# Patient Record
Sex: Male | Born: 1995 | Race: White | Hispanic: No | Marital: Single | Smoking: Never smoker
Health system: Southern US, Community
[De-identification: ages and names within clinical notes are randomized; demographics above are authoritative.]

---
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
## 2014-08-22 ENCOUNTER — Emergency Department (HOSPITAL_COMMUNITY)
Admission: EM | Admit: 2014-08-22 | Discharge: 2014-08-22 | Disposition: A | Discharge status: Home / Self Care | Payer: Medicaid Other | Attending: Emergency Medicine | Admitting: Emergency Medicine

## 2014-08-22 ENCOUNTER — Encounter (HOSPITAL_COMMUNITY): Payer: Self-pay | Admitting: Emergency Medicine

## 2014-08-22 DIAGNOSIS — Z79899 Other long term (current) drug therapy: Secondary | ICD-10-CM | POA: Insufficient documentation

## 2014-08-22 DIAGNOSIS — J069 Acute upper respiratory infection, unspecified: Secondary | ICD-10-CM | POA: Diagnosis not present

## 2014-08-22 DIAGNOSIS — R05 Cough: Secondary | ICD-10-CM | POA: Diagnosis present

## 2014-08-22 MED ORDER — BENZONATATE 100 MG PO CAPS
200.0000 mg | ORAL_CAPSULE | Freq: Once | ORAL | Status: AC
  Administered 2014-08-22: 200 mg via ORAL
  Filled 2014-08-22: qty 2

## 2014-08-22 MED ORDER — BENZONATATE 100 MG PO CAPS: 100.0000 mg | ORAL_CAPSULE | Freq: Three times a day (TID) | ORAL | Status: DC

## 2014-08-22 MED ORDER — PROMETHAZINE-CODEINE 6.25-10 MG/5ML PO SYRP: 5.0000 mL | ORAL_SOLUTION | ORAL | Status: AC | PRN

## 2014-08-22 NOTE — ED Provider Notes (Signed)
CSN: [id number]     Arrival date & time 08/22/14  1929 History   First MD Initiated Contact with Patient 08/22/14 1952     Chief Complaint  Patient presents with  . Cough     (Consider location/radiation/quality/duration/timing/severity/associated sxs/prior Treatment) The history is provided by the patient.   Adam LombardDaniel T Yokum is a 18 y.o. male presenting with 5 day history of uri type symptoms which includes nasal congestion with clear rhinorrhea, dry throat, low grade fever to 100 yesterday and nonproductive cough.  Symptoms due to not include shortness of breath, chest pain,  Nausea, vomiting or diarrhea.  The patient has taken dayquill and mucinex prior to arrival with no significant improvement in symptoms.      History reviewed. No pertinent past medical history. History reviewed. No pertinent past surgical history. No family history on file. History  Substance Use Topics  . Smoking status: Never Smoker   . Smokeless tobacco: Not on file  . Alcohol Use: No    Review of Systems  Constitutional: Positive for fever. Negative for chills.  HENT: Positive for congestion, rhinorrhea and sore throat. Negative for ear pain, sinus pressure, trouble swallowing and voice change.   Eyes: Negative for discharge.  Respiratory: Positive for cough. Negative for chest tightness, shortness of breath, wheezing and stridor.   Cardiovascular: Negative for chest pain.  Gastrointestinal: Negative for abdominal pain.  Genitourinary: Negative.       Allergies  Review of patient's allergies indicates no known allergies.  Home Medications   Prior to Admission medications   Medication Sig Start Date End Date Taking? Authorizing Provider  Phenylephrine-DM-GG (MUCINEX CONGEST & COUGH CHILD) 2.5-5-100 MG/5ML LIQD Take 5-10 mLs by mouth daily as needed (for cough/congestion).   Yes Historical Provider, MD  Pseudoephedrine-APAP-DM [phone] MG/15ML LIQD Take 5-10 mLs by mouth daily as needed (for  cough and cold).   Yes Historical Provider, MD  benzonatate (TESSALON) 100 MG capsule Take 1-2 capsules (100-200 mg total) by mouth every 8 (eight) hours. 08/22/14   Burgess AmorJulie Davidlee Jeanbaptiste, PA-C  promethazine-codeine (PHENERGAN WITH CODEINE) 6.25-10 MG/5ML syrup Take 5 mLs by mouth every 4 (four) hours as needed for cough. 08/22/14   Burgess AmorJulie Dayne Dekay, PA-C   BP 143/80 mmHg  Pulse 101  Temp(Src) 98.8 F (37.1 C) (Oral)  Resp 18  Wt 218 lb 4 oz (98.998 kg)  SpO2 100% Physical Exam  Constitutional: He is oriented to person, place, and time. He appears well-developed and well-nourished.  HENT:  Head: Normocephalic and atraumatic.  Right Ear: Tympanic membrane and ear canal normal.  Left Ear: Tympanic membrane and ear canal normal.  Nose: Mucosal edema and rhinorrhea present.  Mouth/Throat: Uvula is midline and mucous membranes are normal. Posterior oropharyngeal erythema present. No oropharyngeal exudate, posterior oropharyngeal edema or tonsillar abscesses.  Mild posterior erythema, no edema, no exudate.  Eyes: Conjunctivae are normal.  Cardiovascular: Normal rate and normal heart sounds.   Pulmonary/Chest: Effort normal. No respiratory distress. He has no wheezes. He has no rales.  Abdominal: Soft. There is no tenderness.  Musculoskeletal: Normal range of motion.  Neurological: He is alert and oriented to person, place, and time.  Skin: Skin is warm and dry. No rash noted.  Psychiatric: He has a normal mood and affect.    ED Course  Procedures (including critical care time) Labs Review Labs Reviewed - No data to display  Imaging Review No results found.   EKG Interpretation None      MDM   Final  diagnoses:  Acute URI    Rest, increased fluid intake, vicks stick/halls menthol cough drops for nasal congestion.  Prescribed tessalon and phenergan/codeine for cough. Prn f/u, advised return here for any worsened sx.  The patient appears reasonably screened and/or stabilized for discharge and  I doubt any other medical condition or other [facility] requiring further screening, evaluation, or treatment in the ED at this time prior to discharge.     Burgess AmorJulie Shariyah Eland, PA-C 08/22/14 2037  Shon Batonourtney F Horton, MD 08/23/14 [phone]

## 2014-08-22 NOTE — ED Notes (Signed)
Patient given discharge instruction, verbalized understand. Patient ambulatory out of the department.  

## 2014-08-22 NOTE — Discharge Instructions (Signed)
Upper Respiratory Infection, Adult An upper respiratory infection (URI) is also sometimes known as the common cold. The upper respiratory tract includes the nose, sinuses, throat, trachea, and bronchi. Bronchi are the airways leading to the lungs. Most people improve within 1 week, but symptoms can last up to 2 weeks. A residual cough may last even longer.  CAUSES Many different viruses can infect the tissues lining the upper respiratory tract. The tissues become irritated and inflamed and often become very moist. Mucus production is also common. A cold is contagious. You can easily spread the virus to others by oral contact. This includes kissing, sharing a glass, coughing, or sneezing. Touching your mouth or nose and then touching a surface, which is then touched by another person, can also spread the virus. SYMPTOMS  Symptoms typically develop 1 to 3 days after you come in contact with a cold virus. Symptoms vary from person to person. They may include:  Runny nose.  Sneezing.  Nasal congestion.  Sinus irritation.  Sore throat.  Loss of voice (laryngitis).  Cough.  Fatigue.  Muscle aches.  Loss of appetite.  Headache.  Low-grade fever. DIAGNOSIS  You might diagnose your own cold based on familiar symptoms, since most people get a cold 2 to 3 times a year. Your caregiver can confirm this based on your exam. Most importantly, your caregiver can check that your symptoms are not due to another disease such as strep throat, sinusitis, pneumonia, asthma, or epiglottitis. Blood tests, throat tests, and X-rays are not necessary to diagnose a common cold, but they may sometimes be helpful in excluding other more serious diseases. Your caregiver will decide if any further tests are required. RISKS AND COMPLICATIONS  You may be at risk for a more severe case of the common cold if you smoke cigarettes, have chronic heart disease (such as heart failure) or lung disease (such as asthma), or if  you have a weakened immune system. The very young and very old are also at risk for more serious infections. Bacterial sinusitis, middle ear infections, and bacterial pneumonia can complicate the common cold. The common cold can worsen asthma and chronic obstructive pulmonary disease (COPD). Sometimes, these complications can require emergency medical care and may be life-threatening. PREVENTION  The best way to protect against getting a cold is to practice good hygiene. Avoid oral or hand contact with people with cold symptoms. Wash your hands often if contact occurs. There is no clear evidence that vitamin C, vitamin E, echinacea, or exercise reduces the chance of developing a cold. However, it is always recommended to get plenty of rest and practice good nutrition. TREATMENT  Treatment is directed at relieving symptoms. There is no cure. Antibiotics are not effective, because the infection is caused by a virus, not by bacteria. Treatment may include:  Increased fluid intake. Sports drinks offer valuable electrolytes, sugars, and fluids.  Breathing heated mist or steam (vaporizer or shower).  Eating chicken soup or other clear broths, and maintaining good nutrition.  Getting plenty of rest.  Using gargles or lozenges for comfort.  Controlling fevers with ibuprofen or acetaminophen as directed by your caregiver.  Increasing usage of your inhaler if you have asthma. Zinc gel and zinc lozenges, taken in the first 24 hours of the common cold, can shorten the duration and lessen the severity of symptoms. Pain medicines may help with fever, muscle aches, and throat pain. A variety of non-prescription medicines are available to treat congestion and runny nose. Your caregiver   can make recommendations and may suggest nasal or lung inhalers for other symptoms.  HOME CARE INSTRUCTIONS   Only take over-the-counter or prescription medicines for pain, discomfort, or fever as directed by your  caregiver.  Use a warm mist humidifier or inhale steam from a shower to increase air moisture. This may keep secretions moist and make it easier to breathe.  Drink enough water and fluids to keep your urine clear or pale yellow.  Rest as needed.  Return to work when your temperature has returned to normal or as your caregiver advises. You may need to stay home longer to avoid infecting others. You can also use a face mask and careful hand washing to prevent spread of the virus. SEEK MEDICAL CARE IF:   After the first few days, you feel you are getting worse rather than better.  You need your caregiver's advice about medicines to control symptoms.  You develop chills, worsening shortness of breath, or brown or red sputum. These may be signs of pneumonia.  You develop yellow or brown nasal discharge or pain in the face, especially when you bend forward. These may be signs of sinusitis.  You develop a fever, swollen neck glands, pain with swallowing, or white areas in the back of your throat. These may be signs of strep throat. SEEK IMMEDIATE MEDICAL CARE IF:   You have a fever.  You develop severe or persistent headache, ear pain, sinus pain, or chest pain.  You develop wheezing, a prolonged cough, cough up blood, or have a change in your usual mucus (if you have chronic lung disease).  You develop sore muscles or a stiff neck. Document Released: 03/11/2001 Document Revised: 12/08/2011 Document Reviewed: 12/21/2013 Exit[employer] Patient Information 2015 [employer]. This information is not intended to replace advice given to you by your health care provider. Make sure you discuss any questions you have with your health care provider.  

## 2014-08-22 NOTE — ED Notes (Signed)
Pt c/o dry cough, stuffy nose and fever x 5 days.

## 2021-08-25 ENCOUNTER — Ambulatory Visit
Admission: EM | Admit: 2021-08-25 | Discharge: 2021-08-25 | Disposition: A | Discharge status: Home / Self Care | Payer: Medicare Other

## 2021-08-25 ENCOUNTER — Other Ambulatory Visit: Payer: Self-pay

## 2021-08-25 DIAGNOSIS — Z20822 Contact with and (suspected) exposure to covid-19: Secondary | ICD-10-CM | POA: Diagnosis not present

## 2021-08-25 DIAGNOSIS — R0989 Other specified symptoms and signs involving the circulatory and respiratory systems: Secondary | ICD-10-CM

## 2021-08-25 MED ORDER — OSELTAMIVIR PHOSPHATE 75 MG PO CAPS: 75.0000 mg | ORAL_CAPSULE | Freq: Two times a day (BID) | ORAL | 0 refills | Status: AC

## 2021-08-25 MED ORDER — ALBUTEROL SULFATE HFA 108 (90 BASE) MCG/ACT IN AERS
2.0000 | INHALATION_SPRAY | Freq: Four times a day (QID) | RESPIRATORY_TRACT | Status: DC | PRN
  Administered 2021-08-25: 15:00:00 2 via RESPIRATORY_TRACT

## 2021-08-25 MED ORDER — BENZONATATE 100 MG PO CAPS: 200.0000 mg | ORAL_CAPSULE | Freq: Three times a day (TID) | ORAL | 0 refills | Status: AC | PRN

## 2021-08-25 MED ORDER — PREDNISONE 20 MG PO TABS: 40.0000 mg | ORAL_TABLET | Freq: Every day | ORAL | 0 refills | Status: AC

## 2021-08-25 NOTE — ED Provider Notes (Signed)
Renaldo Fiddler    CSN: [id number] Arrival date & time: 08/25/21  1028      History   Chief Complaint No chief complaint on file.   HPI DAVARIOUS TUMBLESON is a 25 y.o. male.   HPI Patient presents with one day onset of sinus congestion, facial pain, body aches, poor appetite, cough and fever TMAX 103.5 F. Endorses mild shortness of breath. He vapes routinely. No history of asthma.   History reviewed. No pertinent past medical history.  Patient Active Problem List   Diagnosis Date Noted   ANKLE SPRAIN 10/19/2008    History reviewed. No pertinent surgical history.     Home Medications    Prior to Admission medications   Medication Sig Start Date End Date Taking? Authorizing Provider  benzonatate (TESSALON) 100 MG capsule Take 2 capsules (200 mg total) by mouth 3 (three) times daily as needed for cough. 08/25/21  Yes Bing Neighbors, FNP  CITALOPRAM & DIET MANAGE PROD PO Take by mouth. 11/23/20  Yes [provider]  omeprazole (PRILOSEC) 20 MG capsule Take 20 mg by mouth daily.   Yes [provider]  oseltamivir (TAMIFLU) 75 MG capsule Take 1 capsule (75 mg total) by mouth 2 (two) times daily. 08/25/21  Yes Bing Neighbors, FNP  predniSONE (DELTASONE) 20 MG tablet Take 2 tablets (40 mg total) by mouth daily with breakfast. 08/25/21  Yes Bing Neighbors, FNP  Phenylephrine-DM-GG [facility] CONGEST & COUGH CHILD) 2.5-5-100 MG/5ML LIQD Take 5-10 mLs by mouth daily as needed (for cough/congestion).    [provider]  promethazine-codeine (PHENERGAN WITH CODEINE) 6.25-10 MG/5ML syrup Take 5 mLs by mouth every 4 (four) hours as needed for cough. 08/22/14   Burgess Amor, PA-C  Pseudoephedrine-APAP-DM 03-559-74 MG/15ML LIQD Take 5-10 mLs by mouth daily as needed (for cough and cold).    [provider]    Family History History reviewed. No pertinent family history.  Social History Social History   Tobacco Use   Smoking status:  Never   Smokeless tobacco: Never  Vaping Use   Vaping Use: Every day  Substance Use Topics   Alcohol use: No    Comment: Occasionally   Drug use: No     Allergies   Patient has no known allergies.   Review of Systems Review of Systems Pertinent negatives listed in HPI   Physical Exam Triage Vital Signs ED Triage Vitals  Enc Vitals Group     BP 08/25/21 1446 (!) 136/91     Pulse Rate 08/25/21 1446 (!) 123     Resp 08/25/21 1446 18     Temp 08/25/21 1446 (!) 100.9 F (38.3 C)     Temp Source 08/25/21 1446 Oral     SpO2 08/25/21 1446 98 %     Weight --      Height --      Head Circumference --      Peak Flow --      Pain Score 08/25/21 1256 6     Pain Loc --      Pain Edu? --      Excl. in GC? --    No data found.  Updated Vital Signs BP (!) 136/91 (BP Location: Right Arm)   Pulse (!) 123   Temp (!) 100.9 F (38.3 C) (Oral)   Resp 18   SpO2 98%   Visual Acuity Right Eye Distance:   Left Eye Distance:   Bilateral Distance:    Right Eye  Near:   Left Eye Near:    Bilateral Near:     Physical Exam  General Appearance:    Alert, cooperative, no distress  HENT:   Normocephalic, ears normal, nares mucosal edema with congestion, rhinorrhea, oropharynx  patent   Eyes:    PERRL, conjunctiva/corneas clear, EOM's intact       Lungs:     Clear to auscultation bilaterally, respirations unlabored  Heart:    Regular rate and rhythm  Neurologic:   Awake, alert, oriented x 3. No apparent focal neurological           defect.     UC Treatments / Results  Labs (all labs ordered are listed, but only abnormal results are displayed) Labs Reviewed  COVID-19, FLU A+B NAA - Abnormal; Notable for the following components:      Result Value   Influenza A, NAA Detected (*)    All other components within normal limits   Narrative:    Performed at:  [address], [place], [place]  [id number] Lab Director: Jolene Schimke MD, Phone:  [phone]     EKG   Radiology No results found.  Procedures Procedures (including critical care time)  Medications Ordered in UC Medications - No data to display  Initial Impression / Assessment and Plan / UC Course  I have reviewed the triage vital signs and the nursing notes.  Pertinent labs & imaging results that were available during my care of the patient were reviewed by me and considered in my medical decision making (see chart for details).    Suspected influenza virus  COVID/Flu test pending. Symptom management warranted only.  Manage fever with Tylenol and ibuprofen.  Nasal symptoms with over-the-counter antihistamines recommended.  Treatment per discharge medications/discharge instructions.  Red flags/ER precautions given. The most current CDC isolation/quarantine recommendation advised.    Final Clinical Impressions(s) / UC Diagnoses   Final diagnoses:  Exposure to COVID-19 virus  Suspected novel influenza A virus infection     Discharge Instructions      Your COVID/Fluresults should result within 3-5 days. Negative results are immediately resulted to Mychart. Positive results will receive a follow-up call from our clinic. If symptoms are present, I recommend home quarantine until results are known.  Alternate Tylenol and ibuprofen as needed for body aches and fever.  Symptom management per recommendations discussed today.  If any breathing difficulty or chest pain develops go immediately to the closest emergency department for evaluation.      ED Prescriptions     Medication Sig Dispense Auth. Provider   predniSONE (DELTASONE) 20 MG tablet Take 2 tablets (40 mg total) by mouth daily with breakfast. 10 tablet Bing Neighbors, FNP   oseltamivir (TAMIFLU) 75 MG capsule Take 1 capsule (75 mg total) by mouth 2 (two) times daily. 10 capsule Bing Neighbors, FNP   benzonatate (TESSALON) 100 MG capsule Take 2 capsules (200 mg total) by mouth 3 (three) times daily as  needed for cough. 40 capsule Bing Neighbors, FNP      PDMP not reviewed this encounter.   Bing Neighbors, FNP 08/31/21 1520

## 2021-08-25 NOTE — ED Triage Notes (Signed)
Patient states he is having bad sinus issues. His entire face is hurting. He states he is experiencing cough, body aches and fever of 103.5.   He took Mucinex without much relief. Last dose at 7pm. He took ibuprofen and robitussin at 11pm last night.  Shortness of breath.

## 2021-08-25 NOTE — Discharge Instructions (Signed)
Your COVID/Flu results should result within 3-5 days. °Negative results are immediately resulted to Mychart. Positive results will receive a follow-up call from our clinic. If symptoms are present, I recommend home quarantine until results are known.  °Alternate Tylenol and ibuprofen as needed for body aches and fever.  Symptom management per recommendations discussed today.  If any breathing difficulty or chest pain develops go immediately to the closest emergency department for evaluation.  °

## 2021-08-26 LAB — COVID-19, FLU A+B NAA
Influenza A, NAA: DETECTED — AB
Influenza B, NAA: NOT DETECTED
SARS-CoV-2, NAA: NOT DETECTED

## 2022-12-13 ENCOUNTER — Encounter (HOSPITAL_COMMUNITY): Payer: Self-pay

## 2022-12-13 ENCOUNTER — Other Ambulatory Visit: Payer: Self-pay

## 2022-12-13 ENCOUNTER — Emergency Department (HOSPITAL_COMMUNITY)
Admission: EM | Admit: 2022-12-13 | Discharge: 2022-12-13 | Disposition: A | Discharge status: Home / Self Care | Payer: Medicare Other | Attending: Emergency Medicine | Admitting: Emergency Medicine

## 2022-12-13 DIAGNOSIS — R600 Localized edema: Secondary | ICD-10-CM | POA: Diagnosis not present

## 2022-12-13 DIAGNOSIS — R2241 Localized swelling, mass and lump, right lower limb: Secondary | ICD-10-CM

## 2022-12-13 DIAGNOSIS — M7989 Other specified soft tissue disorders: Secondary | ICD-10-CM | POA: Diagnosis present

## 2022-12-13 NOTE — ED Notes (Signed)
Patient Alert and oriented to baseline. Stable and ambulatory to baseline. Patient verbalized understanding of the discharge instructions.  Patient belongings were taken by the patient.   

## 2022-12-13 NOTE — ED Provider Notes (Signed)
[facility] Provider Note   CSN: [id number] Arrival date & time: 12/13/22  1427     History  Chief Complaint  Patient presents with   Foot Swelling    Adam Whitehead is a 27 y.o. male.  HPI   This patient is a 27 year old male on citalopram and omeprazole, no other chronic medical conditions, states that he has anxiety which is disabling for him.  Notes that he had acute onset of swelling of his right foot today in the middle of the day.  It felt tight, he noticed it was visibly swollen compared to the left side, no trauma no pain no fevers.  He denies stepping on anything, he has been able to ambulate without difficulty, he has never had this problem before.  He has no chest pain or shortness of breath, he has not smoked in several months, has no other risk factors for pulmonary embolism.  Home Medications Prior to Admission medications   Medication Sig Start Date End Date Taking? Authorizing Provider  benzonatate (TESSALON) 100 MG capsule Take 2 capsules (200 mg total) by mouth 3 (three) times daily as needed for cough. 08/25/21   Scot Jun, NP  CITALOPRAM & DIET MANAGE PROD PO Take by mouth. 11/23/20   [provider]  omeprazole (PRILOSEC) 20 MG capsule Take 20 mg by mouth daily.    [provider]  oseltamivir (TAMIFLU) 75 MG capsule Take 1 capsule (75 mg total) by mouth 2 (two) times daily. 08/25/21   Scot Jun, NP  Phenylephrine-DM-GG ([facility]) 2.5-5-100 MG/5ML LIQD Take 5-10 mLs by mouth daily as needed (for cough/congestion).    [provider]  predniSONE (DELTASONE) 20 MG tablet Take 2 tablets (40 mg total) by mouth daily with breakfast. 08/25/21   Scot Jun, NP  promethazine-codeine (PHENERGAN WITH CODEINE) 6.25-10 MG/5ML syrup Take 5 mLs by mouth every 4 (four) hours as needed for cough. 08/22/14   Evalee Jefferson, PA-C  Pseudoephedrine-APAP-DM [id number]  MG/15ML LIQD Take 5-10 mLs by mouth daily as needed (for cough and cold).    [provider]      Allergies    Patient has no known allergies.    Review of Systems   Review of Systems  All other systems reviewed and are negative.   Physical Exam Updated Vital Signs BP (!) 134/98   Pulse 82   Temp 98.2 F (36.8 C) (Oral)   Ht 1.727 m (5\' 8" )   Wt 104.3 kg   SpO2 96%   BMI 34.97 kg/m  Physical Exam Vitals and nursing note reviewed.  Constitutional:      General: He is not in acute distress.    Appearance: He is well-developed.  HENT:     Head: Normocephalic and atraumatic.     Mouth/Throat:     Pharynx: No oropharyngeal exudate.  Eyes:     General: No scleral icterus.       Right eye: No discharge.        Left eye: No discharge.     Conjunctiva/sclera: Conjunctivae normal.     Pupils: Pupils are equal, round, and reactive to light.  Neck:     Thyroid: No thyromegaly.     Vascular: No JVD.  Cardiovascular:     Rate and Rhythm: Normal rate and regular rhythm.     Heart sounds: Normal heart sounds. No murmur heard.    No friction rub. No gallop.  Pulmonary:     Effort: Pulmonary effort is normal. No respiratory distress.     Breath sounds: Normal breath sounds. No wheezing or rales.  Abdominal:     General: Bowel sounds are normal. There is no distension.     Palpations: Abdomen is soft. There is no mass.     Tenderness: There is no abdominal tenderness.  Musculoskeletal:        General: No tenderness. Normal range of motion.     Cervical back: Normal range of motion and neck supple.     Right lower leg: Edema present.     Left lower leg: No edema.     Comments: Edema of the right foot and ankle but not pretibial regions, totally normal pulses at the right foot, normal sensation  Lymphadenopathy:     Cervical: No cervical adenopathy.  Skin:    General: Skin is warm and dry.     Findings: No erythema or rash.  Neurological:     Mental Status: He is  alert.     Coordination: Coordination normal.     Comments: Normal neurologic exam of the right foot including dorsiflexion and plantarflexion at the ankle against resistance, totally normal sensation of the foot  Psychiatric:        Behavior: Behavior normal.     ED Results / Procedures / Treatments   Labs (all labs ordered are listed, but only abnormal results are displayed) Labs Reviewed - No data to display  EKG None  Radiology No results found.  Procedures Procedures    Medications Ordered in ED Medications - No data to display  ED Course/ Medical Decision Making/ A&P                             Medical Decision Making  This patient presents to the ED for concern of foot swelling, unilateral differential diagnosis includes DVT, trauma, dependent edema    Additional history obtained:  Additional history obtained from medical record External records from outside source obtained and reviewed including multiple visits to the ER for minor complaints, no recent admissions to the hospital.  Office visit for anxiety last month   Lab Tests:  I Ordered, and personally interpreted labs.  The pertinent results include: Not indicated   Imaging Studies ordered:  I ordered imaging studies including DVT study to be performed as tomorrow morning, there is no ultrasound available at this time on the Saturday   Medicines ordered and prescription drug management:  I have reviewed the patients home medicines and have made adjustments as needed   Problem List / ED Course:  Focal swelling, low risk for septic arthritis or other significant medical complications, stable for discharge and follow-up tomorrow for ultrasound, patient and mother agreeable   Social Determinants of Health:  Mental health disorder with anxiety, disabling           Final Clinical Impression(s) / ED Diagnoses Final diagnoses:  Localized swelling of right foot    Rx / DC Orders ED  Discharge Orders          Ordered    US Venous Img Lower Unilateral Right        12/13/22 1454              Adam Chapel, MD 12/13/22 1457

## 2022-12-13 NOTE — Discharge Instructions (Signed)
Please return to the emergency department tomorrow morning at the appointed time for your ultrasound.  Get a pair of compression socks from the pharmacy and start to wear them  Keep your foot elevated tonight  ER for severe worsening pain swelling or difficulty breathing

## 2022-12-13 NOTE — ED Triage Notes (Signed)
Pt reports right foot is swollen, denies any injury.

## 2022-12-14 ENCOUNTER — Other Ambulatory Visit (HOSPITAL_COMMUNITY): Payer: Self-pay | Admitting: Emergency Medicine

## 2022-12-14 ENCOUNTER — Ambulatory Visit (HOSPITAL_COMMUNITY)
Admission: RE | Admit: 2022-12-14 | Discharge: 2022-12-14 | Disposition: A | Discharge status: Home / Self Care | Payer: Medicare Other | Source: Ambulatory Visit | Attending: Emergency Medicine | Admitting: Emergency Medicine

## 2022-12-14 DIAGNOSIS — R2241 Localized swelling, mass and lump, right lower limb: Secondary | ICD-10-CM | POA: Diagnosis present

## 2022-12-14 NOTE — ED Provider Notes (Signed)
Inform patient that DVT study was negative.  Patient informed me that he is going to follow-up with his PCP in the next week.   Harriet Pho, PA-C 12/14/22 1105    Fredia Sorrow, MD 12/16/22 1346

## 2024-05-04 ENCOUNTER — Other Ambulatory Visit: Payer: Self-pay

## 2024-05-04 ENCOUNTER — Emergency Department (HOSPITAL_COMMUNITY)

## 2024-05-04 ENCOUNTER — Emergency Department (HOSPITAL_COMMUNITY)
Admission: EM | Admit: 2024-05-04 | Discharge: 2024-05-04 | Disposition: A | Discharge status: Home / Self Care | Attending: Emergency Medicine | Admitting: Emergency Medicine

## 2024-05-04 ENCOUNTER — Encounter (HOSPITAL_COMMUNITY): Payer: Self-pay

## 2024-05-04 DIAGNOSIS — R6884 Jaw pain: Secondary | ICD-10-CM | POA: Diagnosis present

## 2024-05-04 MED ORDER — NAPROXEN 375 MG PO TABS
375.0000 mg | ORAL_TABLET | Freq: Two times a day (BID) | ORAL | 0 refills | Status: AC
Start: 2024-05-04 — End: ?

## 2024-05-04 MED ORDER — NAPROXEN 375 MG PO TABS: 375.0000 mg | ORAL_TABLET | Freq: Two times a day (BID) | ORAL | 0 refills | Status: DC

## 2024-05-04 NOTE — ED Provider Notes (Signed)
 [place] EMERGENCY DEPARTMENT AT [facility] Provider Note   CSN: [id number] Arrival date & time: 05/04/24  9140     Patient presents with: Jaw Pain   Adam Whitehead is a 28 y.o. male.  Denies stated PMH that presents ER for right jaw pain since yesterday, denies trauma but states he feels like his teeth are not lining up properly, he states he lives about a minute he said he could have a jaw dislocation.  He is able to fully open and close but feels like his bite does not lined up like normal and looks different when he looks in the mirror. He denies any swelling, no fever or chills, no double swallowing.   HPI     Prior to Admission medications   Medication Sig Start Date End Date Taking? Authorizing Provider  benzonatate  (TESSALON ) 100 MG capsule Take 2 capsules (200 mg total) by mouth 3 (three) times daily as needed for cough. 08/25/21   Arloa Suzen RAMAN, NP  CITALOPRAM & DIET MANAGE PROD PO Take by mouth. 11/23/20   [provider]  omeprazole (PRILOSEC) 20 MG capsule Take 20 mg by mouth daily.    [provider]  oseltamivir  (TAMIFLU ) 75 MG capsule Take 1 capsule (75 mg total) by mouth 2 (two) times daily. 08/25/21   Arloa Suzen RAMAN, NP  Phenylephrine-DM-GG [facility] CON[facility]ST [facility] COUGH CHILD) 2.5-5-100 MG/5ML LIQD Take 5-10 mLs by mouth daily as needed (for cough/congestion).    [provider]  predniSONE  (DELTASONE ) 20 MG tablet Take 2 tablets (40 mg total) by mouth daily with breakfast. 08/25/21   Arloa Suzen RAMAN, NP  promethazine -codeine  (PHENERGAN  WITH CODEINE ) 6.25-10 MG/5ML syrup Take 5 mLs by mouth every 4 (four) hours as needed for cough. 08/22/14   Idol, Julie, PA-C  Pseudoephedrine-APAP-DM 30-325-15 MG/15ML LIQD Take 5-10 mLs by mouth daily as needed (for cough and cold).    [provider]    Allergies: Patient has no known allergies.    Review of Systems  Updated Vital Signs BP (!) 153/103   Pulse 89   Temp  97.8 F (36.6 C)   Resp 18   Ht 5' 8 (1.727 m)   Wt 104.3 kg   SpO2 98%   BMI 34.96 kg/m   Physical Exam Vitals and nursing note reviewed.  Constitutional:      General: He is not in acute distress.    Appearance: He is well-developed.  HENT:     Head: Normocephalic and atraumatic.     Mouth/Throat:     Mouth: Mucous membranes are moist.     Pharynx: Oropharynx is clear.  Eyes:     Conjunctiva/sclera: Conjunctivae normal.     Pupils: Pupils are equal, round, and reactive to light.  Cardiovascular:     Rate and Rhythm: Normal rate and regular rhythm.     Heart sounds: No murmur heard. Pulmonary:     Effort: Pulmonary effort is normal. No respiratory distress.     Breath sounds: Normal breath sounds.  Abdominal:     Palpations: Abdomen is soft.     Tenderness: There is no abdominal tenderness.  Musculoskeletal:        General: No swelling.     Cervical back: Neck supple.  Skin:    General: Skin is warm and dry.     Capillary Refill: Capillary refill takes less than 2 seconds.  Neurological:     Mental Status: He is alert.  Psychiatric:  Mood and Affect: Mood normal.     (all labs ordered are listed, but only abnormal results are displayed) Labs Reviewed - No data to display  EKG: None  Radiology: No results found.   Procedures   Medications Ordered in the ED - No data to display                                  Medical Decision Making Diagnosis includes but not limited to TMJ syndrome, dental infection, TMJ dislocation, other  ED course: Patient is here for right-sided jaw pain since yesterday and feels like he is not having his teeth lined up properly he was worried about possible jaw dislocation.  He is able to open and close without difficulty and there is no significant crepitus he has some mild right jaw tenderness, will obtain x-ray to rule out dislocation.  X-ray of mandible was independently new viewed interpreted by me, there is no  fracture or dislocation-agree with radiology read   Discussed with patient that x-ray is negative, follow-up with dentist.  OTC NSAIDs for discomfort as needed.  He has no swelling or tenderness to suggest abscess.  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  Amount and/or Complexity of Data Reviewed Radiology: ordered.        Final diagnoses:  None    ED Discharge Orders     None          Suellen Sherran DELENA DEVONNA 05/04/24 1155    Yolande Lamar BROCKS, MD 05/05/24 1040

## 2024-05-04 NOTE — ED Triage Notes (Signed)
 Pt states the right side of his jaw started hurting yesterday. States his bite feels off and it hurts on the inside of his mouth when he opens it.

## 2024-05-04 NOTE — Discharge Instructions (Addendum)
 Pleasure taking care of you today.  You were seen for right-sided jaw pain and feeling like your teeth do not line up.  Your exam is reassuring and x-rays do not show any sign of dislocation.  You can take the naproxen  as needed for pain and follow-up closely with dentist.  Come back to the ER for new or worsening symptoms, fever, swelling or redness.
